# Patient Record
Sex: Male | Born: 1975 | Race: Black or African American | Hispanic: No | Marital: Single | Smoking: Never smoker
Health system: Southern US, Community
[De-identification: ages and names within clinical notes are randomized; demographics above are authoritative.]

---

## 1999-03-10 ENCOUNTER — Emergency Department (HOSPITAL_COMMUNITY): Admission: EM | Admit: 1999-03-10 | Discharge: 1999-03-10 | Payer: Self-pay | Admitting: Emergency Medicine

## 2000-06-28 ENCOUNTER — Encounter: Payer: Self-pay | Admitting: Emergency Medicine

## 2000-06-28 ENCOUNTER — Emergency Department (HOSPITAL_COMMUNITY): Admission: EM | Admit: 2000-06-28 | Discharge: 2000-06-28 | Payer: Self-pay | Admitting: Emergency Medicine

## 2000-09-07 ENCOUNTER — Emergency Department (HOSPITAL_COMMUNITY): Admission: EM | Admit: 2000-09-07 | Discharge: 2000-09-07 | Payer: Self-pay | Admitting: Emergency Medicine

## 2008-11-14 ENCOUNTER — Emergency Department (HOSPITAL_COMMUNITY): Admission: EM | Admit: 2008-11-14 | Discharge: 2008-11-14 | Payer: Self-pay | Admitting: Emergency Medicine

## 2011-09-13 LAB — URINE MICROSCOPIC-ADD ON

## 2011-09-13 LAB — URINALYSIS, ROUTINE W REFLEX MICROSCOPIC
Bilirubin Urine: NEGATIVE
Glucose, UA: NEGATIVE mg/dL
Ketones, ur: NEGATIVE mg/dL
Nitrite: NEGATIVE
Protein, ur: NEGATIVE mg/dL
Specific Gravity, Urine: 1.022 (ref 1.005–1.030)
Urobilinogen, UA: 1 mg/dL (ref 0.0–1.0)
pH: 7 (ref 5.0–8.0)

## 2011-09-13 LAB — GC/CHLAMYDIA PROBE AMP, GENITAL
Chlamydia, DNA Probe: NEGATIVE
GC Probe Amp, Genital: POSITIVE — AB

## 2011-09-13 LAB — RPR: RPR Ser Ql: NONREACTIVE

## 2011-09-13 LAB — URINE CULTURE
Colony Count: NO GROWTH
Culture: NO GROWTH

## 2012-08-16 ENCOUNTER — Emergency Department (HOSPITAL_BASED_OUTPATIENT_CLINIC_OR_DEPARTMENT_OTHER): Payer: Medicaid Other

## 2012-08-16 ENCOUNTER — Encounter (HOSPITAL_BASED_OUTPATIENT_CLINIC_OR_DEPARTMENT_OTHER): Payer: Self-pay | Admitting: *Deleted

## 2012-08-16 ENCOUNTER — Emergency Department (HOSPITAL_BASED_OUTPATIENT_CLINIC_OR_DEPARTMENT_OTHER)
Admission: EM | Admit: 2012-08-16 | Discharge: 2012-08-17 | Disposition: A | Discharge status: Home / Self Care | Payer: Medicaid Other | Attending: Emergency Medicine | Admitting: Emergency Medicine

## 2012-08-16 DIAGNOSIS — S0121XA Laceration without foreign body of nose, initial encounter: Secondary | ICD-10-CM

## 2012-08-16 DIAGNOSIS — S01501A Unspecified open wound of lip, initial encounter: Secondary | ICD-10-CM | POA: Insufficient documentation

## 2012-08-16 DIAGNOSIS — S01511A Laceration without foreign body of lip, initial encounter: Secondary | ICD-10-CM

## 2012-08-16 DIAGNOSIS — S022XXA Fracture of nasal bones, initial encounter for closed fracture: Secondary | ICD-10-CM | POA: Insufficient documentation

## 2012-08-16 DIAGNOSIS — Y92410 Unspecified street and highway as the place of occurrence of the external cause: Secondary | ICD-10-CM | POA: Insufficient documentation

## 2012-08-16 DIAGNOSIS — S0120XA Unspecified open wound of nose, initial encounter: Secondary | ICD-10-CM | POA: Insufficient documentation

## 2012-08-16 MED ORDER — OXYCODONE-ACETAMINOPHEN 5-325 MG PO TABS
1.0000 | ORAL_TABLET | Freq: Once | ORAL | Status: AC
  Administered 2012-08-16: 1 via ORAL
  Filled 2012-08-16 (×2): qty 1

## 2012-08-16 MED ORDER — LIDOCAINE-EPINEPHRINE 2 %-1:100000 IJ SOLN
INTRAMUSCULAR | Status: AC
  Filled 2012-08-16: qty 1

## 2012-08-16 MED ORDER — CEPHALEXIN 250 MG PO CAPS
500.0000 mg | ORAL_CAPSULE | Freq: Once | ORAL | Status: AC
  Administered 2012-08-17: 500 mg via ORAL
  Filled 2012-08-16: qty 1

## 2012-08-16 MED ORDER — CEPHALEXIN 500 MG PO CAPS: 500.0000 mg | ORAL_CAPSULE | Freq: Four times a day (QID) | ORAL | Status: AC

## 2012-08-16 MED ORDER — OXYCODONE-ACETAMINOPHEN 5-325 MG PO TABS: 1.0000 | ORAL_TABLET | ORAL | Status: AC | PRN

## 2012-08-16 MED ORDER — LIDOCAINE-EPINEPHRINE (PF) 2 %-1:200000 IJ SOLN
10.0000 mL | Freq: Once | INTRAMUSCULAR | Status: DC
  Filled 2012-08-16: qty 10
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
## 2012-08-16 NOTE — ED Notes (Signed)
Dermabond placed at bedside. 
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
## 2012-08-16 NOTE — ED Notes (Signed)
Lac repair done by Dr. Preston Fleeting

## 2012-08-16 NOTE — ED Notes (Signed)
MD at bedside. 
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
## 2012-08-23 ENCOUNTER — Emergency Department (HOSPITAL_BASED_OUTPATIENT_CLINIC_OR_DEPARTMENT_OTHER)
Admission: EM | Admit: 2012-08-23 | Discharge: 2012-08-23 | Disposition: A | Discharge status: Home / Self Care | Payer: Medicaid Other | Attending: Emergency Medicine | Admitting: Emergency Medicine

## 2012-08-23 ENCOUNTER — Encounter (HOSPITAL_BASED_OUTPATIENT_CLINIC_OR_DEPARTMENT_OTHER): Payer: Self-pay | Admitting: Emergency Medicine

## 2012-08-23 DIAGNOSIS — Z4802 Encounter for removal of sutures: Secondary | ICD-10-CM
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
## 2012-08-23 NOTE — ED Notes (Signed)
Stiches to be removed

## 2015-06-03 ENCOUNTER — Emergency Department (HOSPITAL_BASED_OUTPATIENT_CLINIC_OR_DEPARTMENT_OTHER)
Admission: EM | Admit: 2015-06-03 | Discharge: 2015-06-03 | Disposition: A | Discharge status: Home / Self Care | Payer: Medicaid Other | Attending: Emergency Medicine | Admitting: Emergency Medicine

## 2015-06-03 ENCOUNTER — Encounter (HOSPITAL_BASED_OUTPATIENT_CLINIC_OR_DEPARTMENT_OTHER): Payer: Self-pay | Admitting: Emergency Medicine

## 2015-06-03 ENCOUNTER — Emergency Department (HOSPITAL_BASED_OUTPATIENT_CLINIC_OR_DEPARTMENT_OTHER): Payer: Medicaid Other

## 2015-06-03 DIAGNOSIS — Y9289 Other specified places as the place of occurrence of the external cause: Secondary | ICD-10-CM | POA: Insufficient documentation

## 2015-06-03 DIAGNOSIS — Y9389 Activity, other specified: Secondary | ICD-10-CM | POA: Insufficient documentation

## 2015-06-03 DIAGNOSIS — S5001XA Contusion of right elbow, initial encounter: Secondary | ICD-10-CM | POA: Insufficient documentation

## 2015-06-03 DIAGNOSIS — W228XXA Striking against or struck by other objects, initial encounter: Secondary | ICD-10-CM | POA: Insufficient documentation

## 2015-06-03 DIAGNOSIS — Y998 Other external cause status: Secondary | ICD-10-CM | POA: Diagnosis not present

## 2015-06-03 DIAGNOSIS — T148XXA Other injury of unspecified body region, initial encounter: Secondary | ICD-10-CM

## 2015-06-03 DIAGNOSIS — S59911A Unspecified injury of right forearm, initial encounter: Secondary | ICD-10-CM | POA: Diagnosis present

## 2015-06-03 MED ORDER — METHOCARBAMOL 500 MG PO TABS: 500.0000 mg | ORAL_TABLET | Freq: Two times a day (BID) | ORAL | Status: AC

## 2015-06-03 MED ORDER — LIDOCAINE 5 % EX PTCH: 1.0000 | MEDICATED_PATCH | CUTANEOUS | Status: AC

## 2015-06-03 MED ORDER — ACETAMINOPHEN 500 MG PO TABS
1000.0000 mg | ORAL_TABLET | Freq: Once | ORAL | Status: AC
  Administered 2015-06-03: 1000 mg via ORAL
  Filled 2015-06-03: qty 2

## 2015-06-03 MED ORDER — METHOCARBAMOL 500 MG PO TABS
1000.0000 mg | ORAL_TABLET | Freq: Once | ORAL | Status: AC
  Administered 2015-06-03: 1000 mg via ORAL
  Filled 2015-06-03: qty 2

## 2015-06-03 NOTE — Discharge Instructions (Signed)
Cryotherapy °Cryotherapy means treatment with cold. Ice or gel packs can be used to reduce both pain and swelling. Ice is the most helpful within the first 24 to 48 hours after an injury or flare-up from overusing a muscle or joint. Sprains, strains, spasms, burning pain, shooting pain, and aches can all be eased with ice. Ice can also be used when recovering from surgery. Ice is effective, has very few side effects, and is safe for most people to use. °PRECAUTIONS  °Ice is not a safe treatment option for people with: °· Raynaud phenomenon. This is a condition affecting small blood vessels in the extremities. Exposure to cold may cause your problems to return. °· Cold hypersensitivity. There are many forms of cold hypersensitivity, including: °¨ Cold urticaria. Red, itchy hives appear on the skin when the tissues begin to warm after being iced. °¨ Cold erythema. This is a red, itchy rash caused by exposure to cold. °¨ Cold hemoglobinuria. Red blood cells break down when the tissues begin to warm after being iced. The hemoglobin that carry oxygen are passed into the urine because they cannot combine with blood proteins fast enough. °· Numbness or altered sensitivity in the area being iced. °If you have any of the following conditions, do not use ice until you have discussed cryotherapy with your caregiver: °· Heart conditions, such as arrhythmia, angina, or chronic heart disease. °· High blood pressure. °· Healing wounds or open skin in the area being iced. °· Current infections. °· Rheumatoid arthritis. °· Poor circulation. °· Diabetes. °Ice slows the blood flow in the region it is applied. This is beneficial when trying to stop inflamed tissues from spreading irritating chemicals to surrounding tissues. However, if you expose your skin to cold temperatures for too long or without the proper protection, you can damage your skin or nerves. Watch for signs of skin damage due to cold. °HOME CARE INSTRUCTIONS °Follow  these tips to use ice and cold packs safely. °· Place a dry or damp towel between the ice and skin. A damp towel will cool the skin more quickly, so you may need to shorten the time that the ice is used. °· For a more rapid response, add gentle compression to the ice. °· Ice for no more than 10 to 20 minutes at a time. The bonier the area you are icing, the less time it will take to get the benefits of ice. °· Check your skin after 5 minutes to make sure there are no signs of a poor response to cold or skin damage. °· Rest 20 minutes or more between uses. °· Once your skin is numb, you can end your treatment. You can test numbness by very lightly touching your skin. The touch should be so light that you do not see the skin dimple from the pressure of your fingertip. When using ice, most people will feel these normal sensations in this order: cold, burning, aching, and numbness. °· Do not use ice on someone who cannot communicate their responses to pain, such as small children or people with dementia. °HOW TO MAKE AN ICE PACK °Ice packs are the most common way to use ice therapy. Other methods include ice massage, ice baths, and cryosprays. Muscle creams that cause a cold, tingly feeling do not offer the same benefits that ice offers and should not be used as a substitute unless recommended by your caregiver. °To make an ice pack, do one of the following: °· Place crushed ice or a   bag of frozen vegetables in a sealable plastic bag. Squeeze out the excess air. Place this bag inside another plastic bag. Slide the bag into a pillowcase or place a damp towel between your skin and the bag. °· Mix 3 parts water with 1 part rubbing alcohol. Freeze the mixture in a sealable plastic bag. When you remove the mixture from the freezer, it will be slushy. Squeeze out the excess air. Place this bag inside another plastic bag. Slide the bag into a pillowcase or place a damp towel between your skin and the bag. °SEEK MEDICAL CARE  IF: °· You develop white spots on your skin. This may give the skin a blotchy (mottled) appearance. °· Your skin turns blue or pale. °· Your skin becomes waxy or hard. °· Your swelling gets worse. °MAKE SURE YOU:  °· Understand these instructions. °· Will watch your condition. °· Will get help right away if you are not doing well or get worse. °Document Released: 07/22/2011 Document Revised: 04/11/2014 Document Reviewed: 07/22/2011 °[employer]® Patient Information ©2015 [employer]. This information is not intended to replace advice given to you by your health care provider. Make sure you discuss any questions you have with your health care provider. ° °

## 2015-06-03 NOTE — ED Provider Notes (Signed)
CSN: [id number]     Arrival date & time 06/03/15  0034 History   First MD Initiated Contact with Patient 06/03/15 0200     Chief Complaint  Patient presents with  . Arm Pain     (Consider location/radiation/quality/duration/timing/severity/associated sxs/prior Treatment) Patient is a 39 y.o. male presenting with arm pain. The history is provided by the patient.  Arm Pain This is a new problem. The current episode started more than 2 days ago. The problem occurs constantly. Pertinent negatives include no chest pain, no abdominal pain, no headaches and no shortness of breath. Nothing aggravates the symptoms. He has tried nothing for the symptoms. The treatment provided no relief.  Bumped his elbow on car door 3 days ago, now with pain.  No bruising  FROM  History reviewed. No pertinent past medical history. History reviewed. No pertinent past surgical history. History reviewed. No pertinent family history. History  Substance Use Topics  . Smoking status: Never Smoker   . Smokeless tobacco: Not on file  . Alcohol Use: No    Review of Systems  Respiratory: Negative for shortness of breath.   Cardiovascular: Negative for chest pain, palpitations and leg swelling.  Gastrointestinal: Negative for abdominal pain.  Neurological: Negative for headaches.  All other systems reviewed and are negative.     Allergies  Ibuprofen  Home Medications   Prior to Admission medications   Medication Sig Start Date End Date Taking? Authorizing Provider  diphenhydrAMINE (BENADRYL) 25 MG tablet Take 50 mg by mouth once as needed. For allergies    Historical Provider, MD  lidocaine (LIDODERM) 5 % Place 1 patch onto the skin daily. Remove & Discard patch within 12 hours or as directed by MD 06/03/15   Cabrini Ruggieri, MD  methocarbamol (ROBAXIN) 500 MG tablet Take 1 tablet (500 mg total) by mouth 2 (two) times daily. 06/03/15   Momin Misko, MD   BP 116/68 mmHg  Pulse 74  Temp(Src) 98 F (36.7 C)  (Oral)  Resp 16  Ht 5\' 11"  (1.803 m)  Wt 135 lb (61.236 kg)  BMI 18.84 kg/m2  SpO2 99% Physical Exam  Constitutional: He is oriented to person, place, and time. He appears well-developed and well-nourished. No distress.  HENT:  Head: Normocephalic and atraumatic.  Mouth/Throat: Oropharynx is clear and moist.  Eyes: Conjunctivae are normal. Pupils are equal, round, and reactive to light.  Neck: Normal range of motion. Neck supple.  Cardiovascular: Normal rate, regular rhythm and intact distal pulses.   Pulmonary/Chest: Effort normal and breath sounds normal. He has no wheezes. He has no rales.  Abdominal: Soft. Bowel sounds are normal. There is no tenderness. There is no rebound and no guarding.  Musculoskeletal: Normal range of motion.       Right elbow: Normal.He exhibits no effusion and no deformity.       Right wrist: Normal.       Right hand: Normal. He exhibits normal capillary refill. Normal sensation noted. Normal strength noted.  No effusion, 5/5 RUE strength intact biceps and triceps tendons  Neurological: He is alert and oriented to person, place, and time. He has normal reflexes.  Skin: Skin is warm and dry.  Psychiatric: He has a normal mood and affect.    ED Course  Procedures (including critical care time) Labs Review Labs Reviewed - No data to display  Imaging Review Dg Elbow Complete Right  06/03/2015   CLINICAL DATA:  Hit elbow on car door 3 days ago, persistent pain and  swelling.  EXAM: RIGHT ELBOW - COMPLETE 3+ VIEW  COMPARISON:  None.  FINDINGS: There is no evidence of fracture, dislocation, or joint effusion. There is no evidence of arthropathy or other focal bone abnormality. Soft tissues are unremarkable.  IMPRESSION: Negative.   Electronically Signed   By: Awilda Metro M.D.   On: 06/03/2015 01:37     EKG Interpretation None      MDM   Final diagnoses:  Contusion    No fracture of effusion.  FROM.  Ice elevation and medication    Francie Keeling, MD 06/03/15 1610

## 2015-06-03 NOTE — ED Notes (Signed)
Pt hit right elbow on car door 3 days ago. Pain x2 days.  Swelling started today.

## 2017-02-18 IMAGING — DX DG ELBOW COMPLETE 3+V*R*
4 series · 4 of 4 positions shown · non-contrast
Comparison: None.

CLINICAL DATA: Hit elbow on car door 3 days ago, persistent pain
and swelling.

EXAM:
RIGHT ELBOW - COMPLETE 3+ VIEW

[elbow ap]
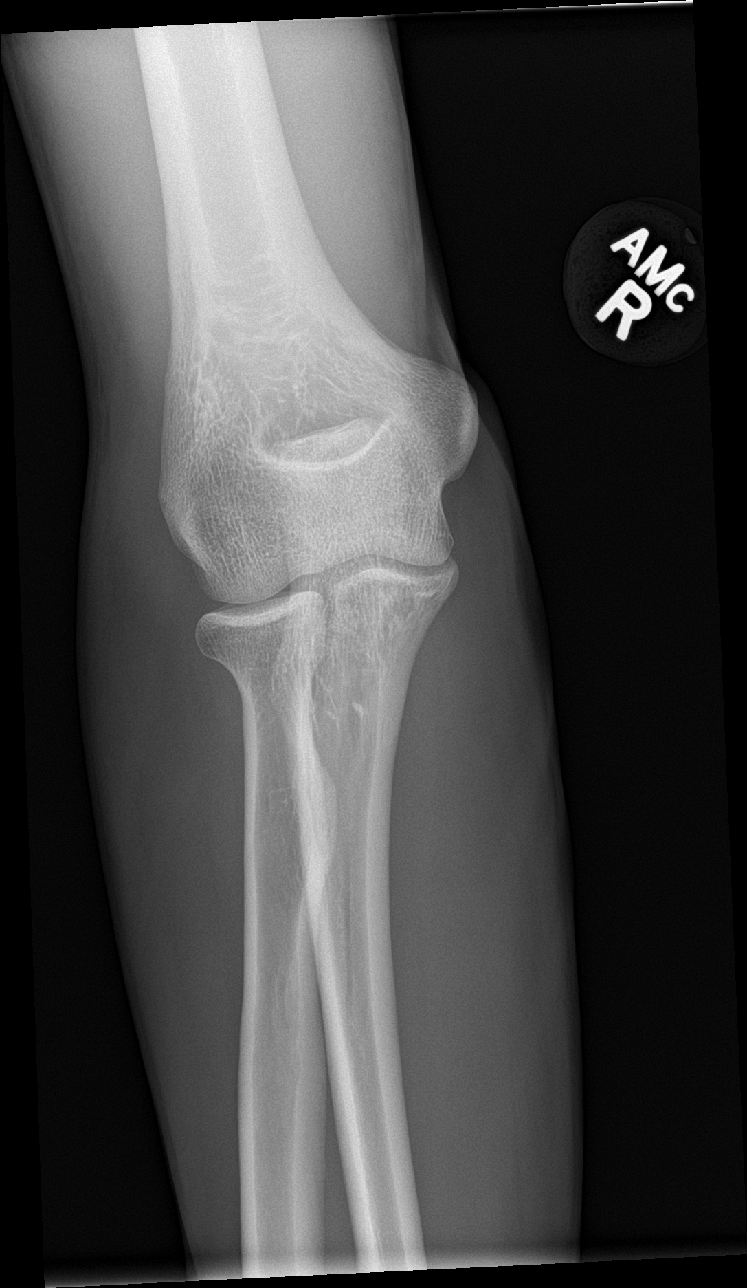

[elbow obl (1 of 2)]
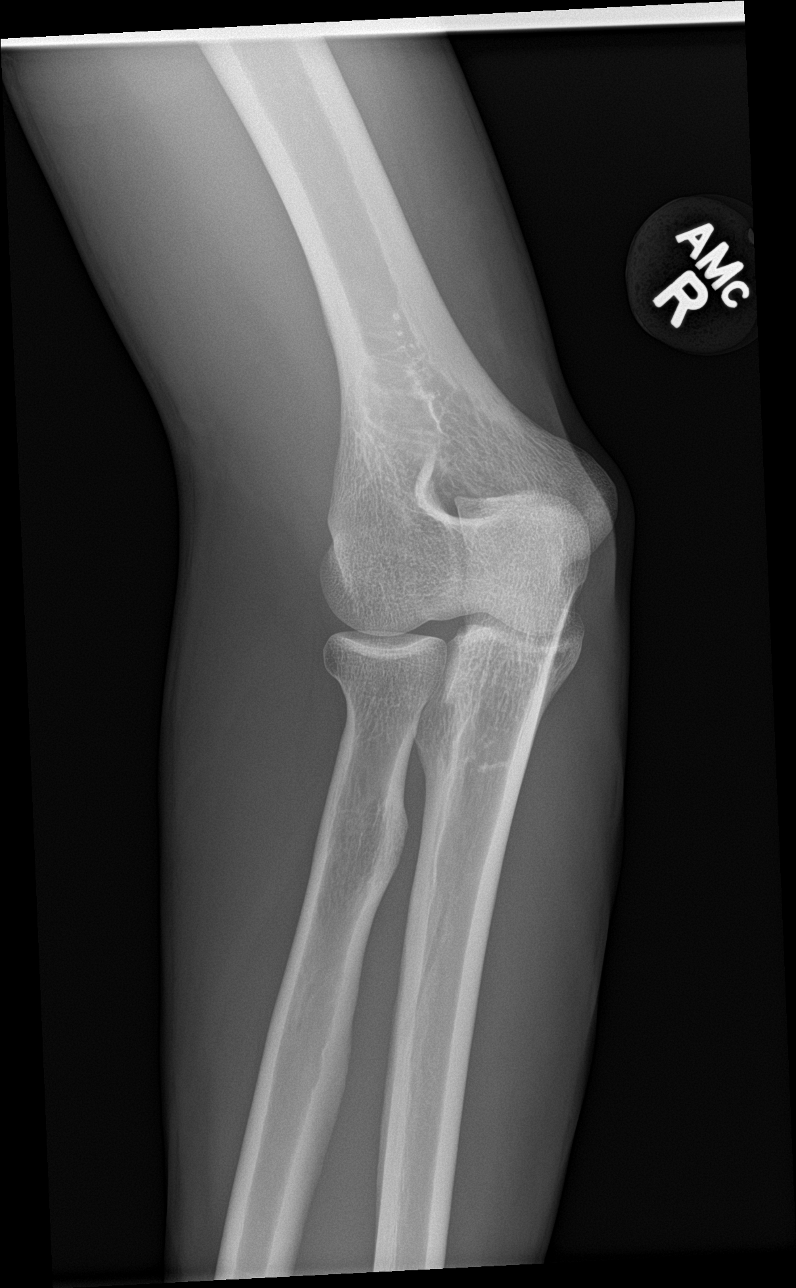

[elbow obl (2 of 2)]
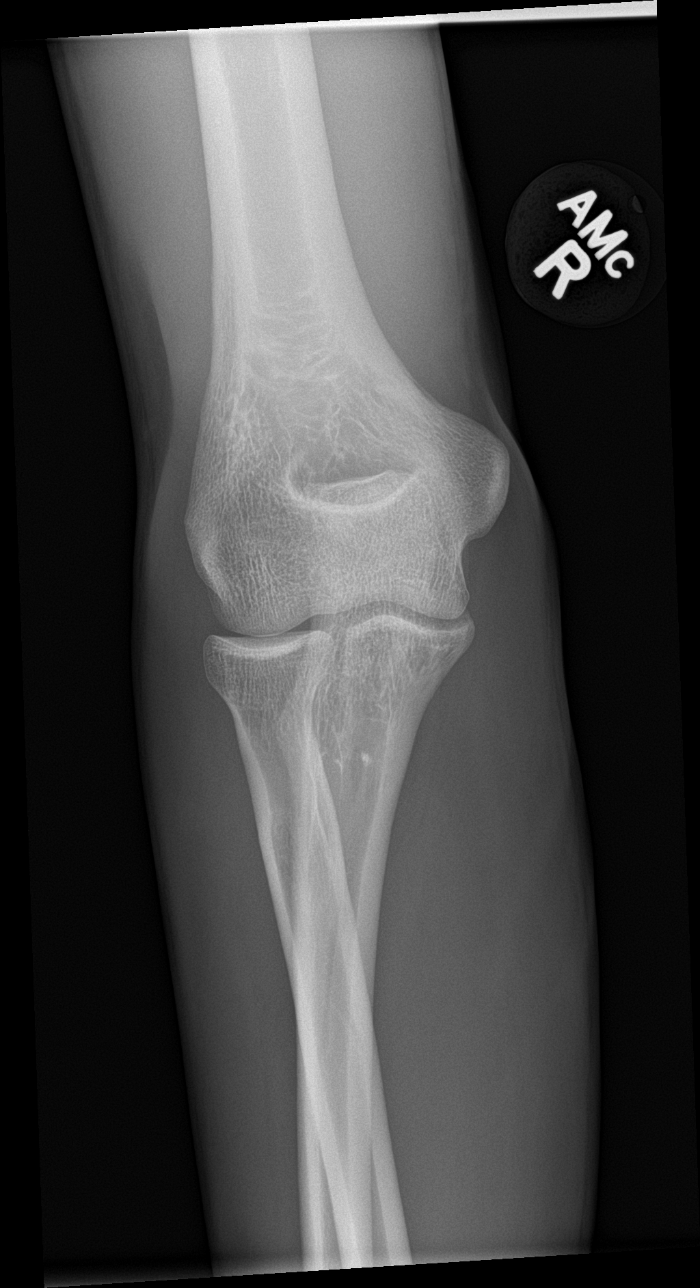

[elbow lat]
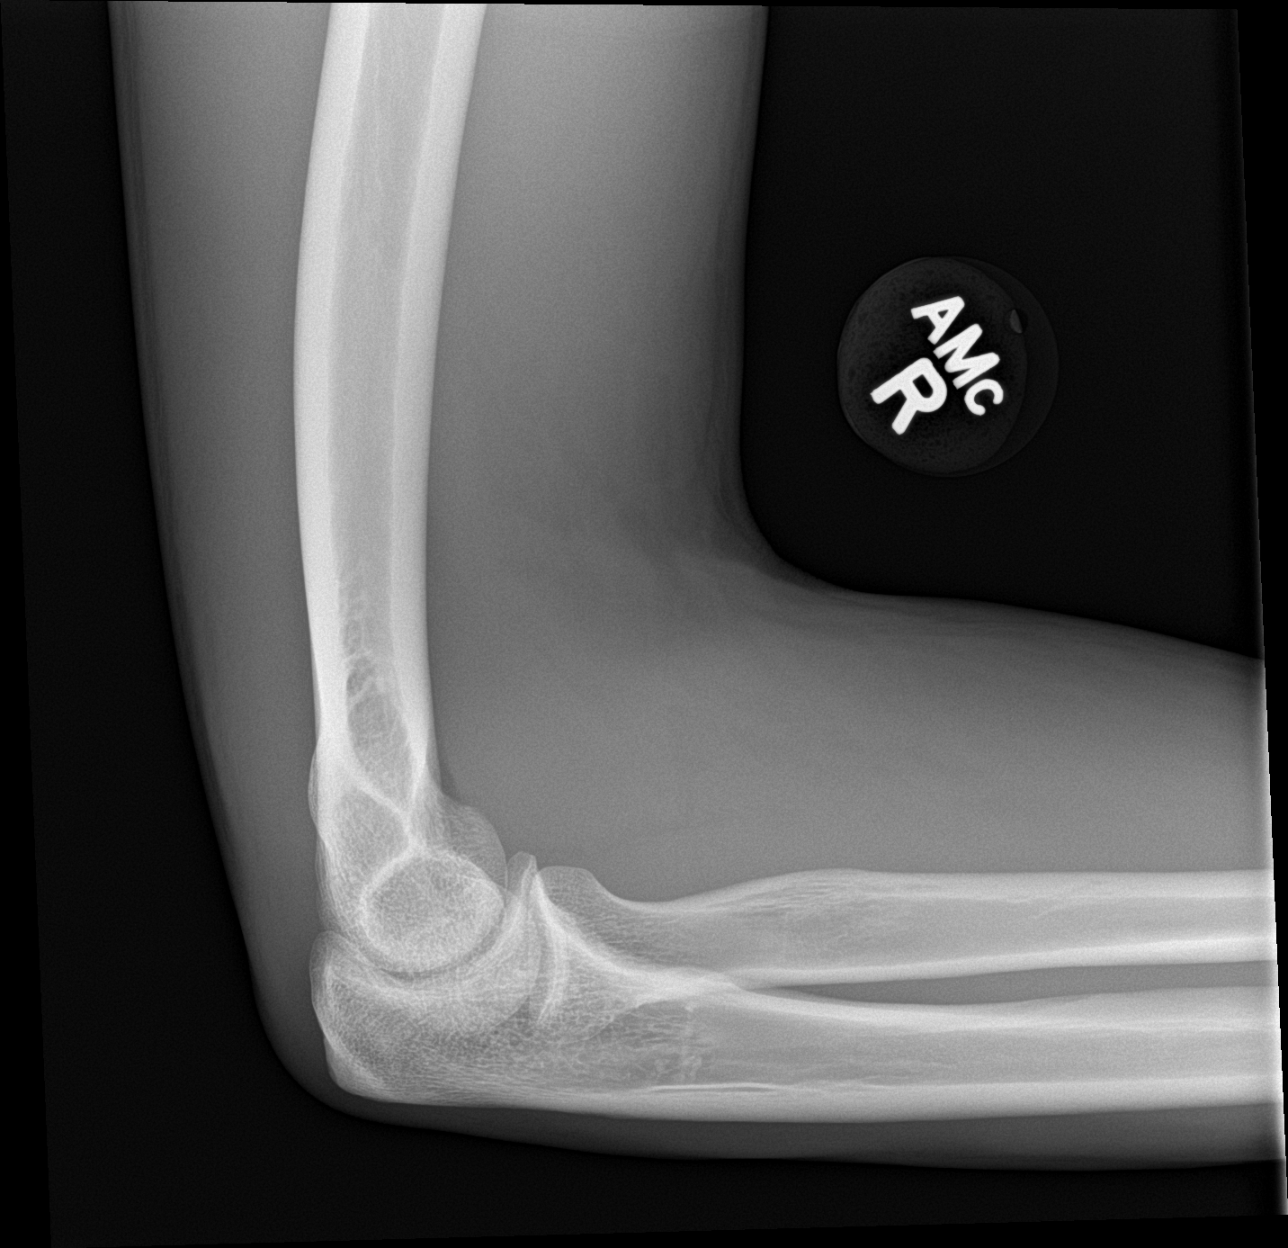

[4 of 4 positions shown; findings below may reference images not displayed]

FINDINGS: There is no evidence of fracture, dislocation, or joint effusion.
There is no evidence of arthropathy or other focal bone abnormality.
Soft tissues are unremarkable.
IMPRESSION: Negative.
